# Patient Record
Sex: Male | Born: 1945 | Race: White | Hispanic: No | Marital: Single | Smoking: Current every day smoker
Health system: Southern US, Community
[De-identification: ages and names within clinical notes are randomized; demographics above are authoritative.]

## PROBLEM LIST (undated history)

## (undated) DIAGNOSIS — F4310 Post-traumatic stress disorder, unspecified: Secondary | ICD-10-CM

## (undated) HISTORY — PX: FEMUR FRACTURE SURGERY: SHX633

---

## 1998-07-02 ENCOUNTER — Emergency Department (HOSPITAL_COMMUNITY): Admission: EM | Admit: 1998-07-02 | Discharge: 1998-07-02 | Payer: Self-pay | Admitting: Emergency Medicine

## 1999-06-24 ENCOUNTER — Encounter: Payer: Self-pay | Admitting: Orthopedic Surgery

## 1999-06-24 ENCOUNTER — Emergency Department (HOSPITAL_COMMUNITY): Admission: EM | Admit: 1999-06-24 | Discharge: 1999-06-24 | Payer: Self-pay | Admitting: Emergency Medicine

## 1999-06-24 ENCOUNTER — Encounter: Payer: Self-pay | Admitting: Emergency Medicine

## 2003-06-03 ENCOUNTER — Emergency Department (HOSPITAL_COMMUNITY): Admission: EM | Admit: 2003-06-03 | Discharge: 2003-06-03 | Payer: Self-pay | Admitting: Emergency Medicine

## 2004-04-08 ENCOUNTER — Emergency Department (HOSPITAL_COMMUNITY): Admission: EM | Admit: 2004-04-08 | Discharge: 2004-04-09 | Payer: Self-pay | Admitting: Emergency Medicine

## 2004-04-18 ENCOUNTER — Observation Stay (HOSPITAL_COMMUNITY): Admission: AD | Admit: 2004-04-18 | Discharge: 2004-04-19 | Payer: Self-pay | Admitting: Specialist
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
## 2019-03-09 ENCOUNTER — Emergency Department (HOSPITAL_COMMUNITY)
Admission: EM | Admit: 2019-03-09 | Discharge: 2019-03-11 | Disposition: A | Discharge status: Home / Self Care | Payer: Medicare HMO | Attending: Emergency Medicine | Admitting: Emergency Medicine

## 2019-03-09 ENCOUNTER — Encounter (HOSPITAL_COMMUNITY): Payer: Self-pay | Admitting: *Deleted

## 2019-03-09 ENCOUNTER — Other Ambulatory Visit: Payer: Self-pay

## 2019-03-09 DIAGNOSIS — F10229 Alcohol dependence with intoxication, unspecified: Secondary | ICD-10-CM | POA: Diagnosis present

## 2019-03-09 DIAGNOSIS — Z79899 Other long term (current) drug therapy: Secondary | ICD-10-CM | POA: Insufficient documentation

## 2019-03-09 DIAGNOSIS — F4310 Post-traumatic stress disorder, unspecified: Secondary | ICD-10-CM | POA: Diagnosis not present

## 2019-03-09 DIAGNOSIS — Z046 Encounter for general psychiatric examination, requested by authority: Secondary | ICD-10-CM

## 2019-03-09 DIAGNOSIS — R45850 Homicidal ideations: Secondary | ICD-10-CM

## 2019-03-09 DIAGNOSIS — F17210 Nicotine dependence, cigarettes, uncomplicated: Secondary | ICD-10-CM | POA: Insufficient documentation

## 2019-03-09 DIAGNOSIS — F99 Mental disorder, not otherwise specified: Secondary | ICD-10-CM | POA: Diagnosis present

## 2019-03-09 HISTORY — DX: Post-traumatic stress disorder, unspecified: F43.10

## 2019-03-09 LAB — COMPREHENSIVE METABOLIC PANEL
ALT: 23 U/L (ref 0–44)
AST: 26 U/L (ref 15–41)
Albumin: 3.6 g/dL (ref 3.5–5.0)
Alkaline Phosphatase: 61 U/L (ref 38–126)
Anion gap: 12 (ref 5–15)
BUN: 12 mg/dL (ref 8–23)
CO2: 21 mmol/L — ABNORMAL LOW (ref 22–32)
Calcium: 9.3 mg/dL (ref 8.9–10.3)
Chloride: 106 mmol/L (ref 98–111)
Creatinine, Ser: 0.96 mg/dL (ref 0.61–1.24)
GFR calc Af Amer: >60 mL/min (ref >60)
GFR calc non Af Amer: >60 mL/min (ref >60)
Glucose, Bld: 132 mg/dL — ABNORMAL HIGH (ref 70–99)
Potassium: 3.9 mmol/L (ref 3.5–5.1)
Sodium: 139 mmol/L (ref 135–145)
Total Bilirubin: 0.5 mg/dL (ref 0.3–1.2)
Total Protein: 6.7 g/dL (ref 6.5–8.1)

## 2019-03-09 LAB — RAPID URINE DRUG SCREEN, HOSP PERFORMED
Amphetamines: NOT DETECTED
Barbiturates: NOT DETECTED
Benzodiazepines: NOT DETECTED
Cocaine: NOT DETECTED
Opiates: NOT DETECTED
Tetrahydrocannabinol: NOT DETECTED

## 2019-03-09 LAB — CBC WITH DIFFERENTIAL/PLATELET
Abs Immature Granulocytes: 0.05 10*3/uL (ref 0.00–0.07)
Basophils Absolute: 0.1 10*3/uL (ref 0.0–0.1)
Basophils Relative: 1 %
Eosinophils Absolute: 0.1 10*3/uL (ref 0.0–0.5)
Eosinophils Relative: 1 %
HCT: 46.5 % (ref 39.0–52.0)
Hemoglobin: 15.2 g/dL (ref 13.0–17.0)
Immature Granulocytes: 1 %
Lymphocytes Relative: 29 %
Lymphs Abs: 2.8 10*3/uL (ref 0.7–4.0)
MCH: 31.1 pg (ref 26.0–34.0)
MCHC: 32.7 g/dL (ref 30.0–36.0)
MCV: 95.1 fL (ref 80.0–100.0)
Monocytes Absolute: 0.6 10*3/uL (ref 0.1–1.0)
Monocytes Relative: 7 %
Neutro Abs: 6 10*3/uL (ref 1.7–7.7)
Neutrophils Relative %: 61 %
Platelets: 252 10*3/uL (ref 150–400)
RBC: 4.89 MIL/uL (ref 4.22–5.81)
RDW: 13.9 % (ref 11.5–15.5)
WBC: 9.6 10*3/uL (ref 4.0–10.5)
nRBC: 0 % (ref 0.0–0.2)

## 2019-03-09 LAB — ETHANOL: Alcohol, Ethyl (B): 30 mg/dL — ABNORMAL HIGH (ref <10)

## 2019-03-09 MED ORDER — IBUPROFEN 400 MG PO TABS: 600.0000 mg | ORAL_TABLET | Freq: Three times a day (TID) | ORAL | Status: DC | PRN

## 2019-03-09 MED ORDER — THIAMINE HCL 100 MG/ML IJ SOLN: 100.0000 mg | Freq: Every day | INTRAMUSCULAR | Status: DC

## 2019-03-09 MED ORDER — LORAZEPAM 2 MG/ML IJ SOLN: 0.0000 mg | Freq: Two times a day (BID) | INTRAMUSCULAR | Status: DC

## 2019-03-09 MED ORDER — LORAZEPAM 2 MG/ML IJ SOLN: 0.0000 mg | Freq: Four times a day (QID) | INTRAMUSCULAR | Status: DC

## 2019-03-09 MED ORDER — VITAMIN B-1 100 MG PO TABS
100.0000 mg | ORAL_TABLET | Freq: Every day | ORAL | Status: DC
  Administered 2019-03-09 – 2019-03-10 (×2): 100 mg via ORAL
  Filled 2019-03-09 (×2): qty 1

## 2019-03-09 MED ORDER — LORAZEPAM 1 MG PO TABS: 0.0000 mg | ORAL_TABLET | Freq: Four times a day (QID) | ORAL | Status: DC

## 2019-03-09 MED ORDER — ONDANSETRON HCL 4 MG PO TABS: 4.0000 mg | ORAL_TABLET | Freq: Three times a day (TID) | ORAL | Status: DC | PRN

## 2019-03-09 MED ORDER — LORAZEPAM 1 MG PO TABS: 0.0000 mg | ORAL_TABLET | Freq: Two times a day (BID) | ORAL | Status: DC

## 2019-03-09 MED ORDER — NICOTINE 21 MG/24HR TD PT24: 21.0000 mg | MEDICATED_PATCH | Freq: Every day | TRANSDERMAL | Status: DC | PRN

## 2019-03-09 MED ORDER — ALUM & MAG HYDROXIDE-SIMETH 200-200-20 MG/5ML PO SUSP: 30.0000 mL | Freq: Four times a day (QID) | ORAL | Status: DC | PRN
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
## 2019-03-09 NOTE — ED Notes (Signed)
Pt alert and oriented. Pt denies any si/hi and avh. Pt cooperative and resting in bed. Pt denies any pain or discomfort at this time. Sitter at the bedside.pt safe will continue to monitor.

## 2019-03-09 NOTE — ED Notes (Signed)
Pt moved to Purple.  Report given to Robin, RN in [facility].

## 2019-03-09 NOTE — ED Notes (Signed)
Donell Sievert, PA, recommends overnight observation for safety and stabilization due to unable to contract for his and nieces safety  if returns back home tonight. Melina Schools, RN, informed of disposition.
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
## 2019-03-10 DIAGNOSIS — R45850 Homicidal ideations: Secondary | ICD-10-CM

## 2019-03-10 DIAGNOSIS — F4310 Post-traumatic stress disorder, unspecified: Secondary | ICD-10-CM | POA: Diagnosis not present

## 2019-03-10 DIAGNOSIS — F10229 Alcohol dependence with intoxication, unspecified: Secondary | ICD-10-CM | POA: Diagnosis not present

## 2019-03-10 NOTE — Progress Notes (Signed)
CSW met with patient at bedside briefly to obtain information from him regarding his plan for discharge. RN assisted patient in contacting his great niece Lysbeth Galas to see if she could assist with him. CSW obtained permission from patient to contact his great niece Lysbeth Galas and he stated yes.  CSW spoke with patient's great niece Lysbeth Galas who was extremely pleasant and totally agreed to come retrieve patient tomorrow morning from [facility] ED. Lysbeth Galas stated that she was unable to come today due to having to make arrangements for her uncle's arrival. Lysbeth Galas requested to please NOT notify her mother Clearnce Sorrel of the patient's discharge plan and whereabouts. Lysbeth Galas stated this situation can be addressed within the family and she does not want to cause the patient any extra stress. Lysbeth Galas resides in [place] and stated she would be here to pick up the patient before 9am on 03/11/2019.  CSW spoke with Elmo Putt, House Coverage to obtain permission for patient to stay in [facility] ED overnight to ensure a safe discharge and she was agreeable.  Please do not inform Earline Mayotte (patient's emergency contact and niece) of the discharge plan to be picked up by his great niece Lysbeth Galas tomorrow.   Madilyn Fireman, MSW, [place] Clinical Social Worker Emergency Department [employer] [phone]

## 2019-03-10 NOTE — Progress Notes (Signed)
Received Patrick Daugherty awake in his room with the sitter at the bedside. He denied all psychiatric symptoms and his CIWA score was 0 at 1930 hrs. The repeat CIWA score was 0 this AM. He was OOB to the bathroom several times throughout the night with an unsteady gait.
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
## 2019-03-10 NOTE — ED Notes (Signed)
Pt given snack and offered shower. Pt sts "no thank you, i'm ok." in reference to shower.

## 2019-03-10 NOTE — ED Notes (Signed)
IV C papers copied .  Original IVC papers in red basket for magistrate. Copy with sticker placed in drawer for medical records. IVC papers faxed to [facility]. 3 copies in chart
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
## 2019-03-10 NOTE — ED Notes (Signed)
Pt will be staying and plan is to DC with great niece tomorrow.

## 2019-03-10 NOTE — ED Notes (Signed)
Pt calm ,cooperative. Flat. Dull. Guarded. Denied SI/HI. Denied A/V hallucinations. Adequate PO intake.

## 2019-03-11 NOTE — ED Notes (Signed)
Pt discharged home. Discharged instructions read to pt who verbalized understanding. All belongings returned to pt who signed for same. Denies SI/HI, is not delusional and not responding to internal stimuli. Escorted pt to the ED exit.   Per Okey Regal with SW the IVC is rescinded by Dr. Patria Mane.

## 2019-03-11 NOTE — Progress Notes (Signed)
CSW spoke with patient's RN Diane to have EDP Dr. Patria Mane to rescind IVC and complete discharge summary. CSW spoke with Dr. Patria Mane and he agreed to that plan.   CSW informed patient that his great niece Sheria Lang would be here soon to pick him up and patient expressed no further needs or concerns to address.  Edwin Dada, MSW, LCSW-A Clinical Social Worker Emergency Department [employer] [phone]

## 2021-12-06 ENCOUNTER — Other Ambulatory Visit: Payer: Self-pay | Admitting: Otolaryngology

## 2021-12-06 DIAGNOSIS — H903 Sensorineural hearing loss, bilateral: Secondary | ICD-10-CM

## 2022-01-06 ENCOUNTER — Ambulatory Visit
Admission: RE | Admit: 2022-01-06 | Discharge: 2022-01-06 | Disposition: A | Discharge status: Home / Self Care | Payer: Medicare PPO | Source: Ambulatory Visit | Attending: Otolaryngology | Admitting: Otolaryngology

## 2022-01-06 ENCOUNTER — Other Ambulatory Visit: Payer: Self-pay | Admitting: Otolaryngology

## 2022-01-06 ENCOUNTER — Other Ambulatory Visit: Payer: Self-pay

## 2022-01-06 DIAGNOSIS — H903 Sensorineural hearing loss, bilateral: Secondary | ICD-10-CM

## 2022-01-06 MED ORDER — GADOBENATE DIMEGLUMINE 529 MG/ML IV SOLN
15.0000 mL | Freq: Once | INTRAVENOUS | Status: AC | PRN
  Administered 2022-01-06: 15 mL via INTRAVENOUS
